# Patient Record
Sex: Female | Born: 1970 | Race: White | Hispanic: No | Marital: Married
Health system: Southern US, Community
[De-identification: ages and names within clinical notes are randomized; demographics above are authoritative.]

---

## 2019-02-27 ENCOUNTER — Inpatient Hospital Stay
Admit: 2019-02-27 | Discharge: 2019-03-19 | Disposition: E | Payer: Medicare Other | Source: Other Acute Inpatient Hospital | Attending: Internal Medicine | Admitting: Internal Medicine

## 2019-02-27 ENCOUNTER — Other Ambulatory Visit (HOSPITAL_COMMUNITY): Payer: Medicare Other

## 2019-02-27 DIAGNOSIS — Z431 Encounter for attention to gastrostomy: Secondary | ICD-10-CM

## 2019-02-27 DIAGNOSIS — N179 Acute kidney failure, unspecified: Secondary | ICD-10-CM

## 2019-02-27 DIAGNOSIS — R7981 Abnormal blood-gas level: Secondary | ICD-10-CM

## 2019-02-27 DIAGNOSIS — Z931 Gastrostomy status: Secondary | ICD-10-CM

## 2019-02-27 DIAGNOSIS — R109 Unspecified abdominal pain: Secondary | ICD-10-CM

## 2019-02-27 DIAGNOSIS — J9610 Chronic respiratory failure, unspecified whether with hypoxia or hypercapnia: Secondary | ICD-10-CM

## 2019-02-27 DIAGNOSIS — Z4659 Encounter for fitting and adjustment of other gastrointestinal appliance and device: Secondary | ICD-10-CM

## 2019-02-27 DIAGNOSIS — J189 Pneumonia, unspecified organism: Secondary | ICD-10-CM

## 2019-02-27 DIAGNOSIS — J918 Pleural effusion in other conditions classified elsewhere: Secondary | ICD-10-CM

## 2019-02-28 LAB — CBC WITH DIFFERENTIAL/PLATELET
Abs Immature Granulocytes: 0 10*3/uL (ref 0.00–0.07)
Basophils Absolute: 0 10*3/uL (ref 0.0–0.1)
Basophils Relative: 0 %
Eosinophils Absolute: 0.2 10*3/uL (ref 0.0–0.5)
Eosinophils Relative: 3 %
HCT: 34.2 % — ABNORMAL LOW (ref 36.0–46.0)
Hemoglobin: 10.9 g/dL — ABNORMAL LOW (ref 12.0–15.0)
Lymphocytes Relative: 21 %
Lymphs Abs: 1.5 10*3/uL (ref 0.7–4.0)
MCH: 35.6 pg — ABNORMAL HIGH (ref 26.0–34.0)
MCHC: 31.9 g/dL (ref 30.0–36.0)
MCV: 111.8 fL — ABNORMAL HIGH (ref 80.0–100.0)
Monocytes Absolute: 0.3 10*3/uL (ref 0.1–1.0)
Monocytes Relative: 4 %
Neutro Abs: 5.3 10*3/uL (ref 1.7–7.7)
Neutrophils Relative %: 72 %
Platelets: 162 10*3/uL (ref 150–400)
RBC: 3.06 MIL/uL — ABNORMAL LOW (ref 3.87–5.11)
RDW: 17.3 % — ABNORMAL HIGH (ref 11.5–15.5)
WBC: 7.3 10*3/uL (ref 4.0–10.5)
nRBC: 0 % (ref 0.0–0.2)
nRBC: 0 /100 WBC

## 2019-02-28 LAB — URINALYSIS, ROUTINE W REFLEX MICROSCOPIC
Bilirubin Urine: NEGATIVE
Glucose, UA: NEGATIVE mg/dL
Hgb urine dipstick: NEGATIVE
Ketones, ur: NEGATIVE mg/dL
Leukocytes,Ua: NEGATIVE
Nitrite: NEGATIVE
Protein, ur: 30 mg/dL — AB
Specific Gravity, Urine: 1.025 (ref 1.005–1.030)
pH: 7 (ref 5.0–8.0)

## 2019-02-28 LAB — HEMOGLOBIN A1C
Hgb A1c MFr Bld: 6.9 % — ABNORMAL HIGH (ref 4.8–5.6)
Mean Plasma Glucose: 151.33 mg/dL

## 2019-02-28 LAB — COMPREHENSIVE METABOLIC PANEL
ALT: 22 U/L (ref 0–44)
AST: 18 U/L (ref 15–41)
Albumin: 1.7 g/dL — ABNORMAL LOW (ref 3.5–5.0)
Alkaline Phosphatase: 80 U/L (ref 38–126)
Anion gap: 8 (ref 5–15)
BUN: 10 mg/dL (ref 6–20)
CO2: 27 mmol/L (ref 22–32)
Calcium: 8.6 mg/dL — ABNORMAL LOW (ref 8.9–10.3)
Chloride: 104 mmol/L (ref 98–111)
Creatinine, Ser: <0.3 mg/dL — ABNORMAL LOW (ref 0.44–1.00)
Glucose, Bld: 86 mg/dL (ref 70–99)
Potassium: 4.3 mmol/L (ref 3.5–5.1)
Sodium: 139 mmol/L (ref 135–145)
Total Bilirubin: 0.6 mg/dL (ref 0.3–1.2)
Total Protein: 5.4 g/dL — ABNORMAL LOW (ref 6.5–8.1)

## 2019-02-28 MED ORDER — HEPARIN LOCK FLUSH 10 UNIT/ML IV SOLN
5.00 | INTRAVENOUS | Status: DC
Start: ? — End: 2019-02-28

## 2019-02-28 MED ORDER — SODIUM CHLORIDE 0.9 % IV SOLN
10.00 | INTRAVENOUS | Status: DC
Start: ? — End: 2019-02-28

## 2019-02-28 MED ORDER — PROMETHAZINE HCL 25 MG/ML IJ SOLN
12.50 | INTRAMUSCULAR | Status: DC
Start: ? — End: 2019-02-28

## 2019-02-28 MED ORDER — ONDANSETRON HCL 4 MG/2ML IJ SOLN
4.00 | INTRAMUSCULAR | Status: DC
Start: ? — End: 2019-02-28

## 2019-02-28 MED ORDER — GENERIC EXTERNAL MEDICATION
2.00 | Status: DC
Start: 2019-02-27 — End: 2019-02-28

## 2019-02-28 MED ORDER — INSULIN GLARGINE 100 UNIT/ML ~~LOC~~ SOLN
1.00 | SUBCUTANEOUS | Status: DC
Start: ? — End: 2019-02-28

## 2019-02-28 MED ORDER — LOPERAMIDE HCL 1 MG/7.5ML PO LIQD
4.00 | ORAL | Status: DC
Start: 2019-02-27 — End: 2019-02-28

## 2019-02-28 MED ORDER — METRONIDAZOLE IN NACL 5-0.79 MG/ML-% IV SOLN
500.00 | INTRAVENOUS | Status: DC
Start: 2019-02-27 — End: 2019-02-28

## 2019-02-28 MED ORDER — SODIUM CHLORIDE (PF) 0.9 % IJ SOLN
10.00 | INTRAMUSCULAR | Status: DC
Start: 2019-02-27 — End: 2019-02-28

## 2019-02-28 MED ORDER — INSULIN LISPRO 100 UNIT/ML ~~LOC~~ SOLN
1.00 | SUBCUTANEOUS | Status: DC
Start: 2019-02-27 — End: 2019-02-28

## 2019-02-28 MED ORDER — GENERIC EXTERNAL MEDICATION
10.00 | Status: DC
Start: 2019-02-28 — End: 2019-02-28

## 2019-02-28 MED ORDER — FOLIC ACID 1 MG PO TABS
2.00 | ORAL_TABLET | ORAL | Status: DC
Start: 2019-02-28 — End: 2019-02-28

## 2019-02-28 MED ORDER — BISACODYL 10 MG RE SUPP
10.00 | RECTAL | Status: DC
Start: ? — End: 2019-02-28

## 2019-02-28 MED ORDER — INSULIN LISPRO 100 UNIT/ML ~~LOC~~ SOLN
1.00 | SUBCUTANEOUS | Status: DC
Start: ? — End: 2019-02-28

## 2019-02-28 MED ORDER — GENERIC EXTERNAL MEDICATION
Status: DC
Start: ? — End: 2019-02-28

## 2019-02-28 MED ORDER — INSULIN GLARGINE 100 UNIT/ML ~~LOC~~ SOLN
1.00 | SUBCUTANEOUS | Status: DC
Start: 2019-02-27 — End: 2019-02-28

## 2019-02-28 MED ORDER — MORPHINE SULFATE (PF) 2 MG/ML IV SOLN
2.00 | INTRAVENOUS | Status: DC
Start: ? — End: 2019-02-28

## 2019-02-28 MED ORDER — FLUCONAZOLE IN SODIUM CHLORIDE 400-0.9 MG/200ML-% IV SOLN
400.00 | INTRAVENOUS | Status: DC
Start: 2019-02-28 — End: 2019-02-28

## 2019-03-01 LAB — URINE CULTURE: Culture: NO GROWTH

## 2019-03-01 NOTE — Consult Note (Addendum)
Infectious Disease Consultation   Ariana Raymond  FUX:[id number]  DOB: [birth year]  DOA: 03/11/2019   Requesting physician: Dr.Hijazi  Reason for consultation: Antibiotic recommendations   History of Present Illness: History obtained from the medical records and also confirmed with the patient and husband at the bedside. Ariana Raymond is an 48 y.o. female with a history of morbid obesity, history of sleeve gastrectomy, asthma, diabetes mellitus type 2, metastatic breast cancer undergoing chemotherapy, coronary artery disease who initially presented to [facility] on 01/27/2019 complaining of fever and abdominal pain.  CT scan showed air adjacent to the stomach extending into the spleen suggesting gastric perforation.  She was then transferred to [facility] for further care.  Patient was taken to the operating room urgently on 01/27/2019 for exploratory laparotomy with drainage of intra-abdominal abscess.  There was purulent and fibrinous fluid around the stomach and all over the peritoneal cavity in the pelvis.  Cultures showed Candida albicans, Enterococcus, strep viridans.  On 01/30/2019 she developed respiratory distress and was transferred to the ICU.  She was placed on BiPAP and pressors.  CT chest angio showed markedly increased generalized groundglass opacity, patchy and multifocal most compatible with atypical infection including viral pneumonia.  Abdominal imaging showed larger perisplenic collection of fluid and gas.  COVID-19 was negative.  Legionella and pneumococcal antigen was negative.  She had a repeat CT of the chest/abdomen/pelvis done on 02/03/2019 for persistent abdominal pain which showed partial small bowel obstruction and intra-abdominal fluids.  She was taken again to the OR on 02/03/2019 for exploratory laparotomy and debridement of the abdominal wall abscess, repair of gastric perforation and omental flap and feeding tube placement.   Cultures from the surgery again showed yeast, Enterococcus, gram-positive organisms (bacillus, Corynebacterium).  She was initially treated with vancomycin, Eraxis, Zosyn.  On 02/07/2019 she developed severe thrombocytopenia with a drop of platelet count to 3.  D-dimer was elevated but elevated fibrinogen and normal PT/INR.  Therefore DIC was ruled out.  Peripheral smear showed severe thrombocytopenia without schistocytes.  Haptoglobin and LDH was elevated.  Heparin-induced platelet antibodies were negative.  Adams 13 low normal.  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  Heme-onc gave a dose of IVIG on 02/08/2019 and 02/09/2019.  Antimicrobials changed to IV daptomycin, aztreonam, fluconazole.  She had an upper GI with small bowel follow-through on 02/11/2019 showing gastric leak.  Chest x-ray on 02/14/2019 showed improving infiltrates.  Once the patient stabilized she has been discharged to [facility].  Patient at this time is complaining of some mild abdominal discomfort.  She has an NG tube in place.  Denies having any chest pain, shortness of breath, nausea, vomiting or diarrhea at this time.   Review of Systems:  As per HPI otherwise 10 point review of systems negative.    Past Medical History: Morbid obesity, metastatic breast cancer, diabetes mellitus type 2.  Past Surgical History: Recent abdominal surgery for bowel perforation, intra-abdominal abscess.  Allergies: Vancomycin, anidulafungin  Social History: No history of nicotine, alcohol, drug abuse.  Family History: History of diabetes mellitus, hypertension   Physical Exam: Vitals: Temperature 99, pulse 111, respiratory rate 18, blood pressure 110/53, pulse oximetry 90%. Constitutional: Obese, ill-appearing female, awake, oriented Eyes: PERLA, EOMI, irises appear normal, anicteric sclera,  ENMT: external ears and nose appear normal, normal hearing, has NG tube  Lips appears normal, oropharynx mucosa, tongue normal  Neck: neck appears normal, no masses, normal ROM  CVS: S1-S2 clear, no murmur rubs or gallops, LE edema Respiratory: Decreased breath sounds lower lobes, otherwise clear to auscultation. Abdomen: Obese female, abdominal wound VAC in place, tenderness without any guarding or rebound, positive bowel sounds Musculoskeletal: no cyanosis, clubbing, has edema                       Neuro: Cranial nerves II-XII intact, she has debility with some generalized weakness otherwise grossly nonfocal. Psych: judgement and insight appear normal, stable mood and affect, mental status Skin: no rashes, unstageable left gluteal, deep tissue pressure injury right heel, deep tissue pressure injury sacrococcygeal, deep tissue pressure injury left heel  Data reviewed:  I have personally reviewed following labs and imaging studies Labs:  CBC: Recent Labs  Lab 02/28/19 0540  WBC 7.3  NEUTROABS 5.3  HGB 10.9*  HCT 34.2*  MCV 111.8*  PLT 162    Basic Metabolic Panel: Recent Labs  Lab 02/28/19 0540  NA 139  K 4.3  CL 104  CO2 27  GLUCOSE 86  BUN 10  CREATININE <0.30*  CALCIUM 8.6*   GFR CrCl cannot be calculated (This lab value cannot be used to calculate CrCl because it is not a number: <0.30). Liver Function Tests: Recent Labs  Lab 02/28/19 0540  AST 18  ALT 22  ALKPHOS 80  BILITOT 0.6  PROT 5.4*  ALBUMIN 1.7*   No results for input(s): LIPASE, AMYLASE in the last 168 hours. No results for input(s): AMMONIA in the last 168 hours. Coagulation profile No results for input(s): INR, PROTIME in the last 168 hours.  Cardiac Enzymes: No results for input(s): CKTOTAL, CKMB, CKMBINDEX, TROPONINI in the last 168 hours. BNP: Invalid input(s): POCBNP CBG: No results for input(s): GLUCAP in the last 168 hours. D-Dimer No results for input(s): DDIMER in the last 72 hours. Hgb A1c Recent Labs    02/28/19 0540  HGBA1C 6.9*   Lipid  Profile No results for input(s): CHOL, HDL, LDLCALC, TRIG, CHOLHDL, LDLDIRECT in the last 72 hours. Thyroid function studies No results for input(s): TSH, T4TOTAL, T3FREE, THYROIDAB in the last 72 hours.  Invalid input(s): FREET3 Anemia work up No results for input(s): VITAMINB12, FOLATE, FERRITIN, TIBC, IRON, RETICCTPCT in the last 72 hours. Urinalysis    Component Value Date/Time   COLORURINE AMBER (A) 02/28/2019 0927   APPEARANCEUR HAZY (A) 02/28/2019 0927   LABSPEC 1.025 02/28/2019 0927   PHURINE 7.0 02/28/2019 0927   GLUCOSEU NEGATIVE 02/28/2019 0927   HGBUR NEGATIVE 02/28/2019 0927   BILIRUBINUR NEGATIVE 02/28/2019 0927   KETONESUR NEGATIVE 02/28/2019 0927   PROTEINUR 30 (A) 02/28/2019 0927   NITRITE NEGATIVE 02/28/2019 0927   LEUKOCYTESUR NEGATIVE 02/28/2019 1610     Microbiology Recent Results (from the past 240 hour(s))  Culture, Urine     Status: None   Collection Time: 02/28/19  9:28 AM   Specimen: Urine, Random  Result Value Ref Range Status   Specimen Description URINE, RANDOM  Final   Special Requests NONE  Final   Culture   Final    NO GROWTH Performed at [facility], [address], [place], [place] [address]    Report Status 03/01/2019 FINAL  Final       Inpatient Medications:   Please see MAR   Radiological Exams on Admission: Dg Abd 1 View  Result Date: 03-07-19 CLINICAL DATA:  48 year old female with respiratory distress. EXAM: PORTABLE CHEST 1 VIEW COMPARISON:  Chest radiograph dated 07/13/2012 FINDINGS: An enteric tube is noted extending below the diaphragm with tip over the spine, likely in the body of the stomach. Right-sided PICC with tip over central SVC close to the cavoatrial junction. A left subclavian central venous line with tip over the right atrium. Hazy left lung base density may represent atelectasis or developing infiltrate. Clinical correlation is recommended. No large pleural effusion. There is no pneumothorax. Mild  cardiomegaly. No acute osseous pathology. A drainage tube noted under the left hemidiaphragm with tip close to the gastroesophageal junction. An additional drainage catheter is seen in the left hemiabdomen. There is moderate stool in the colon. A dilated loop of small bowel in the left hemiabdomen measures approximately 5.3 cm. IMPRESSION: 1. Lines and tubes as described. 2. Hazy left lung base density may represent atelectasis or developing infiltrate. 3. Mild cardiomegaly. 4. Dilated loop of small bowel in the left hemiabdomen. Clinical correlation is recommended. Electronically Signed   By: Anner Crete M.D.   On: 02/22/2019 17:45   Dg Chest Port 1 View  Result Date: 02/22/2019 CLINICAL DATA:  48 year old female with respiratory distress. EXAM: PORTABLE CHEST 1 VIEW COMPARISON:  Chest radiograph dated 07/13/2012 FINDINGS: An enteric tube is noted extending below the diaphragm with tip over the spine, likely in the body of the stomach. Right-sided PICC with tip over central SVC close to the cavoatrial junction. A left subclavian central venous line with tip over the right atrium. Hazy left lung base density may represent atelectasis or developing infiltrate. Clinical correlation is recommended. No large pleural effusion. There is no pneumothorax. Mild cardiomegaly. No acute osseous pathology. A drainage tube noted under the left hemidiaphragm with tip close to the gastroesophageal junction. An additional drainage catheter is seen in the left hemiabdomen. There is moderate stool in the colon. A dilated loop of small bowel in the left hemiabdomen measures approximately 5.3 cm. IMPRESSION: 1. Lines and tubes as described. 2. Hazy left lung base density may represent atelectasis or developing infiltrate. 3. Mild cardiomegaly. 4. Dilated loop of small bowel in the left hemiabdomen. Clinical correlation is recommended. Electronically Signed   By: Anner Crete M.D.   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  Cultures showed Candida albicans, Enterococcus, strep viridans.  Patient initially was on IV vancomycin, Zosyn, anidulafungin.  However, she developed thrombocytopenia thought to be drug-induced.  The thrombocytopenia was really severe with an acute drop in the platelet count up to 3.  Therefore antibiotics have been changed to IV daptomycin, fluconazole, aztreonam.  Continue current antimicrobials.  She had upper GI series on 02/11/2019 which showed gastric leak.  She will need repeat upper GI series around 03/12/2019 to reevaluate.  As long as she has a gastric leak, recommend to continue with the antimicrobials.  Duration of antibiotics until the gastric perforation closes. Currently has a wound VAC in place.  Continue local wound care.  She is at high risk for recurrent sepsis.  If she starts having worsening fevers,  worsening leukocytosis would recommend to send for repeat pancultures.  Sepsis: This was present on admission.  Currently blood pressure is stable.  However, as mentioned above she is at high risk for recurrent sepsis.  Continue antimicrobials as mentioned above.  If she starts having any worsening fevers, leukocytosis then would recommend to send for repeat pancultures.  Gastric leak: Patient had upper GI series at the outside hospital which showed gastric leak.  She will need repeat upper GI series around  03/12/2019 to reevaluate.  Until then we recommend to continue the current antimicrobials.  Metastatic breast cancer: Patient was on chemotherapy.  Currently not on chemotherapy.  Severe thrombocytopenia: Likely secondary to drug-induced ITP.  Patient had severe thrombocytopenia with a drop in platelet count acutely.  Platelets had dropped to 3.  Heparin-induced platelet antibodies was negative, Adamts 13 was low normal.  She got IVIG 9/23 and 9/24.  Hematology was consulted at the outside facility.  She was also treated with steroids.  Antibiotics changed.  Currently platelet count improved.  Continue to monitor.  Acute hypoxemic respiratory failure: At the outside hospital on 01/30/2019 patient developed respiratory distress and had to be transferred to the ICU.  She was placed on BiPAP and vasopressors.  CT angio at that time showed increased generalized groundglass opacity, patchy and multifocal most compatible with atypical infection including viral pneumonia.  Respiratory bio fire, COVID-19 was negative.  She was extubated on 02/09/2019. On antimicrobials as mentioned above.  Continue to monitor.  If her respiratory status worsens consider obtaining repeat chest imaging.  Left gluteal unstageable pressure ulcer, deep tissue pressure injury right heel, deep tissue pressure injury left heel, deep tissue pressure injury sacrococcygeal: Continue local wound care.  Diabetes mellitus: Continue to monitor Accu-Cheks and medications for diabetes per the primary team.  She will need proper glycemic control in order to enable wound healing.  Due to her complex medical problems she is a high risk for worsening anticoagulation.  Thank you for this consultation.    Time spent 110 minutes more than 50% time spent face-to-face with patient, counseling and coordination of care.  Vonzella NippleAnupama Rondi Ivy M.D. 03/01/2019, 4:53 PM

## 2019-03-03 LAB — CBC
HCT: 34.8 % — ABNORMAL LOW (ref 36.0–46.0)
Hemoglobin: 10.8 g/dL — ABNORMAL LOW (ref 12.0–15.0)
MCH: 35.1 pg — ABNORMAL HIGH (ref 26.0–34.0)
MCHC: 31 g/dL (ref 30.0–36.0)
MCV: 113 fL — ABNORMAL HIGH (ref 80.0–100.0)
Platelets: 197 10*3/uL (ref 150–400)
RBC: 3.08 MIL/uL — ABNORMAL LOW (ref 3.87–5.11)
RDW: 16.7 % — ABNORMAL HIGH (ref 11.5–15.5)
WBC: 7.4 10*3/uL (ref 4.0–10.5)
nRBC: 0 % (ref 0.0–0.2)

## 2019-03-03 LAB — BASIC METABOLIC PANEL
Anion gap: 10 (ref 5–15)
BUN: 11 mg/dL (ref 6–20)
CO2: 20 mmol/L — ABNORMAL LOW (ref 22–32)
Calcium: 8.2 mg/dL — ABNORMAL LOW (ref 8.9–10.3)
Chloride: 105 mmol/L (ref 98–111)
Creatinine, Ser: 0.38 mg/dL — ABNORMAL LOW (ref 0.44–1.00)
GFR calc Af Amer: >60 mL/min (ref >60)
GFR calc non Af Amer: >60 mL/min (ref >60)
Glucose, Bld: 117 mg/dL — ABNORMAL HIGH (ref 70–99)
Potassium: 4.3 mmol/L (ref 3.5–5.1)
Sodium: 135 mmol/L (ref 135–145)

## 2019-03-07 ENCOUNTER — Other Ambulatory Visit (HOSPITAL_COMMUNITY): Payer: Medicare Other

## 2019-03-07 ENCOUNTER — Ambulatory Visit (HOSPITAL_COMMUNITY): Payer: No Typology Code available for payment source | Attending: Internal Medicine

## 2019-03-07 DIAGNOSIS — I2510 Atherosclerotic heart disease of native coronary artery without angina pectoris: Secondary | ICD-10-CM | POA: Diagnosis not present

## 2019-03-07 DIAGNOSIS — E119 Type 2 diabetes mellitus without complications: Secondary | ICD-10-CM | POA: Diagnosis not present

## 2019-03-07 DIAGNOSIS — R609 Edema, unspecified: Secondary | ICD-10-CM

## 2019-03-07 DIAGNOSIS — R0602 Shortness of breath: Secondary | ICD-10-CM | POA: Diagnosis not present

## 2019-03-07 MED ORDER — GENERIC EXTERNAL MEDICATION
Status: DC
Start: ? — End: 2019-03-07

## 2019-03-07 NOTE — Progress Notes (Signed)
Right upper extremity venous duplex complete. Please see CV Proc tab for preliminary results. [place], RVT 4:21 PM  03/07/2019

## 2019-03-07 NOTE — Progress Notes (Signed)
IR requested by Dr. Laren Everts for management of gastrostomy tube dysfunction.  Patient had gastrostomy tube placed in OR at [facility] 02/03/2019 by Dr. Candiss Norse per records.  Dr. Laren Everts states that gastrostomy tube appears in place, however is not functioning properly. Recommend KUB with contrast for evaluation of gastrostomy tube placement. Dr. Laren Everts aware and will place order.  IR to follow.   Bea Graff Setareh Rom, PA-C 03/07/2019, 3:21 PM

## 2019-03-08 LAB — COMPREHENSIVE METABOLIC PANEL
ALT: 20 U/L (ref 0–44)
AST: 30 U/L (ref 15–41)
Albumin: 1.2 g/dL — ABNORMAL LOW (ref 3.5–5.0)
Alkaline Phosphatase: 186 U/L — ABNORMAL HIGH (ref 38–126)
Anion gap: 8 (ref 5–15)
BUN: 10 mg/dL (ref 6–20)
CO2: 22 mmol/L (ref 22–32)
Calcium: 7.9 mg/dL — ABNORMAL LOW (ref 8.9–10.3)
Chloride: 105 mmol/L (ref 98–111)
Creatinine, Ser: 0.41 mg/dL — ABNORMAL LOW (ref 0.44–1.00)
GFR calc Af Amer: >60 mL/min (ref >60)
GFR calc non Af Amer: >60 mL/min (ref >60)
Glucose, Bld: 149 mg/dL — ABNORMAL HIGH (ref 70–99)
Potassium: 5 mmol/L (ref 3.5–5.1)
Sodium: 135 mmol/L (ref 135–145)
Total Bilirubin: 0.8 mg/dL (ref 0.3–1.2)
Total Protein: 4.7 g/dL — ABNORMAL LOW (ref 6.5–8.1)

## 2019-03-08 LAB — CBC
HCT: 34.9 % — ABNORMAL LOW (ref 36.0–46.0)
Hemoglobin: 10.7 g/dL — ABNORMAL LOW (ref 12.0–15.0)
MCH: 34.9 pg — ABNORMAL HIGH (ref 26.0–34.0)
MCHC: 30.7 g/dL (ref 30.0–36.0)
MCV: 113.7 fL — ABNORMAL HIGH (ref 80.0–100.0)
Platelets: 189 10*3/uL (ref 150–400)
RBC: 3.07 MIL/uL — ABNORMAL LOW (ref 3.87–5.11)
RDW: 16 % — ABNORMAL HIGH (ref 11.5–15.5)
WBC: 16.9 10*3/uL — ABNORMAL HIGH (ref 4.0–10.5)
nRBC: 0 % (ref 0.0–0.2)

## 2019-03-09 NOTE — Progress Notes (Addendum)
PROGRESS NOTE    [name]  DQQ:[id number] DOB: 1971-01-25 DOA: Mar 18, 2019  Brief Narrative:  Ariana Raymond is an 48 y.o. female with a history of morbid obesity, history of sleeve gastrectomy, asthma, diabetes mellitus type 2, metastatic breast cancer undergoing chemotherapy, coronary artery disease who initially presented to [facility] on 01/27/2019 complaining of fever and abdominal pain.  CT scan showed air adjacent to the stomach extending into the spleen suggesting gastric perforation.  She was then transferred to [facility] for further care.  Patient was taken to the operating room urgently on 01/27/2019 for exploratory laparotomy with drainage of intra-abdominal abscess.  There was purulent and fibrinous fluid around the stomach and all over the peritoneal cavity in the pelvis.  Cultures showed Candida albicans, Enterococcus, strep viridans.  On 01/30/2019 she developed respiratory distress and was transferred to the ICU.  She was placed on BiPAP and pressors.  CT chest angio showed markedly increased generalized groundglass opacity, patchy and multifocal most compatible with atypical infection including viral pneumonia.  Abdominal imaging showed larger perisplenic collection of fluid and gas.  COVID-19 was negative.  Legionella and pneumococcal antigen was negative.  She had a repeat CT of the chest/abdomen/pelvis done on 02/03/2019 for persistent abdominal pain which showed partial small bowel obstruction and intra-abdominal fluids.  She was taken again to the OR on 02/03/2019 for exploratory laparotomy and debridement of the abdominal wall abscess, repair of gastric perforation and omental flap and feeding tube placement.  Cultures from the surgery again showed yeast, Enterococcus, gram-positive organisms (bacillus, Corynebacterium).  She was initially treated with vancomycin, Eraxis, Zosyn.  On 02/07/2019 she developed severe thrombocytopenia with drop of  platelet count to 3.  D-dimer was elevated but elevated fibrinogen and normal PT/INR.  Therefore DIC was ruled out.  Peripheral smear showed severe thrombocytopenia without schistocytes.  Haptoglobin and LDH was elevated.  Heparin-induced platelet antibodies were negative.  Adams 13 low normal.  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  Heme-onc gave a dose of IVIG on 02/08/2019 and 02/09/2019.  Antimicrobials changed to IV daptomycin, aztreonam, fluconazole.  She had an upper GI with small bowel follow-through on 02/11/2019 showing gastric leak.  Chest x-ray on 02/14/2019 showed improving infiltrates.  Once the patient stabilized she has been discharged to select hospital [facility].  Patient at this time continuing to complain of mild abdominal discomfort.  She has NG tube in place.  Denies having any chest pain, shortness of breath, nausea, vomiting or diarrhea at this time.   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  Cultures showed Candida albicans, Enterococcus, strep viridans.  Patient initially was on IV vancomycin, Zosyn, anidulafungin.  However, she developed thrombocytopenia thought to be drug-induced.  The thrombocytopenia was really severe with an acute drop in the platelet count up to 3.  Therefore antibiotics have been changed to IV daptomycin, fluconazole, aztreonam.  Continue  current antimicrobials.  She had upper GI with small bowel follow-through on 02/11/2019 which showed gastric leak.  She will need  repeat upper GI series with small bowel follow-through around 03/12/2019 to reevaluate.  As long as she has a gastric leak, recommend to continue with the antimicrobials.  Duration of antibiotics until the gastric perforation closes. Currently has a wound VAC in place.  Continue local wound care.  She is at high risk for recurrent sepsis.  If she starts having worsening fevers, would recommend to send for repeat pancultures.  Leukocytosis: Patient found to have elevated WBCs but afebrile.  The leukocytosis could be secondary to the DVT that was diagnosed.  Already on IV daptomycin, aztreonam, fluconazole. Continue current antimicrobials.  Continue to monitor counts closely.  If she starts having any fever spikes would recommend to send for pan cultures.  Sepsis: This was present on admission.  Currently sepsis is resolved.  However, as mentioned above, she is at high risk for recurrent sepsis.  Continue antimicrobials as mentioned above.  If she starts having any worsening fevers, would recommend to send for repeat pancultures.  Gastric leak: Patient had upper GI series with small bowel follow-through at the outside hospital which showed gastric leak.  She will need repeat upper GI series with small bowel follow-through around 03/12/2019 to reevaluate.  Until then recommend to continue the current antimicrobials.  DVT: She had right upper extremity ultrasound which showed findings consistent with short segment of age indeterminate deep venous thrombosis involving the left subclavian vein likely attached to pick.  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  Patient started on anticoagulation per the primary team.  Continue management per the primary team.  Will need to monitor her platelets closely while on anticoagulation as well as while on  antibiotics.  Metastatic breast cancer: Patient was on chemotherapy.  Currently not on chemotherapy.  Severe thrombocytopenia: Likely secondary to drug-induced ITP.  Patient had severe thrombocytopenia with a drop in platelet count acutely.  Platelets had dropped to 3.  Heparin-induced platelet antibodies was negative, Adamts 13 was low normal.  She got IVIG 9/23 and 9/24.  Hematology was consulted at the outside facility.  She was also treated with steroids.  Antibiotics changed.  Currently platelet count improved.  Continue to monitor.  Acute hypoxemic respiratory failure: At the outside hospital on 01/30/2019 patient developed respiratory distress and had to be transferred to the ICU.  She was placed on BiPAP and vasopressors.  CT angio at that time showed increased generalized groundglass opacity, patchy and multifocal most compatible with atypical infection including viral pneumonia.  Respiratory bio fire, COVID-19 was negative.  She was extubated on 02/09/2019. On antimicrobials as mentioned above.  Continue to monitor.  If her respiratory status worsens consider obtaining repeat chest imaging.  Left gluteal unstageable pressure ulcer, deep tissue pressure injury right heel, deep tissue pressure injury left heel, deep tissue pressure injury sacrococcygeal: Continue local wound care.  Diabetes mellitus: Continue to monitor Accu-Cheks and medications for diabetes per the primary team.  She will need proper glycemic control in order to enable wound healing.  Due to her complex medical problems she is a high risk for worsening and decompensation.    Subjective: She is continuing to complain of some abdominal discomfort.  Denies having any nausea, vomiting or diarrhea at this time.  Continues to have NG tube.  Having leukocytosis, no fever. Diagnosed with right upper extremity DVT.  Objective: Vitals: Temperature 98.1, pulse 86, respiratory 14, blood pressure 102/58, oxygen saturation  98%.  Examination:  General exam: Obese female, ill-appearing, awake and oriented, not in  any acute distress at this time HEENT: Atraumatic, normocephalic, pupils equal and reactive, no urine lesion, has NG tube. Respiratory system: Decreased breath sounds lower lobes otherwise clear to auscultation. Respiratory effort normal. Cardiovascular system: S1 & S2 heard, RRR. No murmur. Has pedal edema. Gastrointestinal system: Obese, abdominal wound VAC in place, nonspecific tenderness without any guarding or rebound, positive bowel sounds Central nervous system: Alert and oriented.  Has debility with generalized weakness otherwise no focal neurological deficits. Extremities: Symmetric 5 x 5 power.  Lower extremity edema Skin: No rashes, unstageable left gluteal, and right heel deep tissue pressure injury, deep tissue pressure injury sacrococcygeal, deep tissue pressure injury left heel Psychiatry: Judgement and insight appear normal. Mood & affect appropriate.     Data Reviewed: I have personally reviewed following labs and imaging studies  CBC: Recent Labs  Lab 03/03/19 0605 03/08/19 0547  WBC 7.4 16.9*  HGB 10.8* 10.7*  HCT 34.8* 34.9*  MCV 113.0* 113.7*  PLT 197 189   Basic Metabolic Panel: Recent Labs  Lab 03/03/19 0605 03/08/19 0547  NA 135 135  K 4.3 5.0  CL 105 105  CO2 20* 22  GLUCOSE 117* 149*  BUN 11 10  CREATININE 0.38* 0.41*  CALCIUM 8.2* 7.9*   GFR: CrCl cannot be calculated (Unknown ideal weight.). Liver Function Tests: Recent Labs  Lab 03/08/19 0547  AST 30  ALT 20  ALKPHOS 186*  BILITOT 0.8  PROT 4.7*  ALBUMIN 1.2*   No results for input(s): LIPASE, AMYLASE in the last 168 hours. No results for input(s): AMMONIA in the last 168 hours. Coagulation Profile: No results for input(s): INR, PROTIME in the last 168 hours. Cardiac Enzymes: No results for input(s): CKTOTAL, CKMB, CKMBINDEX, TROPONINI in the last 168 hours. BNP (last 3 results) No  results for input(s): PROBNP in the last 8760 hours. HbA1C: No results for input(s): HGBA1C in the last 72 hours. CBG: No results for input(s): GLUCAP in the last 168 hours. Lipid Profile: No results for input(s): CHOL, HDL, LDLCALC, TRIG, CHOLHDL, LDLDIRECT in the last 72 hours. Thyroid Function Tests: No results for input(s): TSH, T4TOTAL, FREET4, T3FREE, THYROIDAB in the last 72 hours. Anemia Panel: No results for input(s): VITAMINB12, FOLATE, FERRITIN, TIBC, IRON, RETICCTPCT in the last 72 hours. Sepsis Labs: No results for input(s): PROCALCITON, LATICACIDVEN in the last 168 hours.  Recent Results (from the past 240 hour(s))  Culture, Urine     Status: None   Collection Time: 02/28/19  9:28 AM   Specimen: Urine, Random  Result Value Ref Range Status   Specimen Description URINE, RANDOM  Final   Special Requests NONE  Final   Culture   Final    NO GROWTH Performed at [facility], [address] [place], [place] [address]    Report Status 03/01/2019 FINAL  Final         Radiology Studies: Dg Abd 1 View  Result Date: 03/07/2019 CLINICAL DATA:  Peg tube malfunction EXAM: ABDOMEN - 1 VIEW COMPARISON:  May 04, 2019 FINDINGS: Esophageal tube tip overlies the proximal stomach. Gastrostomy tubing visualized over the left mid abdomen. Catheter or drain over the left mid to lower abdomen. Nonobstructed gas pattern. IMPRESSION: 1. Gastrostomy tubing visualized over the left mid abdomen. 2. Removal of left upper quadrant drain. Additional catheter or tubing over the left mid to lower abdomen. 3. Nonobstructed gas pattern Electronically Signed   By: Jasmine PangKim  Fujinaga M.D.   On: 03/07/2019 17:39   [place] Upper Extremity  Venous Duplex  Result Date: 03/08/2019 UPPER VENOUS STUDY  Performing Technologist: Levada Schilling RDMS, RVT  Examination Guidelines: A complete evaluation includes B-mode imaging, spectral Doppler, color Doppler, and power Doppler as needed of all accessible portions  of each vessel. Bilateral testing is considered an integral part of a complete examination. Limited examinations for reoccurring indications may be performed as noted.  Right Findings: +----------+------------+---------+-----------+--------------+----------------+ RIGHT     CompressiblePhasicitySpontaneous  Properties      Summary      +----------+------------+---------+-----------+--------------+----------------+ IJV           Full       Yes       Yes                                   +----------+------------+---------+-----------+--------------+----------------+ Subclavian    Full       Yes       Yes        softly          Age                                                    echogenic    Indeterminate   +----------+------------+---------+-----------+--------------+----------------+ Axillary      Full                                                       +----------+------------+---------+-----------+--------------+----------------+ Brachial      Full                                                       +----------+------------+---------+-----------+--------------+----------------+ Radial        Full                                                       +----------+------------+---------+-----------+--------------+----------------+ Ulnar         Full                                                       +----------+------------+---------+-----------+--------------+----------------+ Cephalic    Partial                                         Chronic      +----------+------------+---------+-----------+--------------+----------------+ Basilic       Full                                                       +----------+------------+---------+-----------+--------------+----------------+  PICC line noted. Small area of echogenicity mid subclavian, likely nonocclusive thrombus on PICC line. Difficult exam due to large bandage on upper right inner arm.   Left Findings: +----------+------------+---------+-----------+----------+-------+ LEFT      CompressiblePhasicitySpontaneousPropertiesSummary +----------+------------+---------+-----------+----------+-------+ Subclavian    Full       Yes       Yes                      +----------+------------+---------+-----------+----------+-------+  Summary:  Right: Findings consistent with a short segment of age indeterminate deep vein thrombosis involving the right subclavian veinlikely attached to PICC. Findings consistent with chronic superficial vein thrombosis involving the right cephalic vein. Difficult evaluation due to large PICC bandage.  *See table(s) above for measurements and observations.  Diagnosing physician: Curt Jews MD Electronically signed by Curt Jews MD on 03/08/2019 at 4:00:20 PM.    Final         Scheduled Meds: Please see MAR  Yaakov Guthrie, MD  03/09/2019, 2:39 PM

## 2019-03-10 NOTE — Progress Notes (Signed)
PROGRESS NOTE    Ariana PattersonKristy Cooper Vonada  NWG:[id number] DOB: 12/26/1970 DOA: 02/21/2019  Brief Narrative:  Ariana PattersonKristy Cooper Everhartis an 48 y.o.femalewith a history of morbid obesity, history of sleeve gastrectomy, asthma, diabetes mellitus type 2, metastatic breast cancer undergoing chemotherapy, coronary artery disease who initially presented to [facility] on 01/27/2019 complaining of fever and abdominal pain. CT scan showed air adjacent to the stomach extending into the spleen suggesting gastric perforation. She was then [facility]. Patient was taken to the operating room urgently on 01/27/2019 for exploratory laparotomy with drainage of intra-abdominal abscess. There was purulent and fibrinous fluid around the stomach and all over the peritoneal cavity in the pelvis. Cultures showed Candida albicans, Enterococcus, strep viridans. On 01/30/2019 she developed respiratory distress and was transferred to the ICU. She was placed on BiPAP and pressors. CT chest angio showed markedly increased generalized groundglass opacity, patchy and multifocal most compatible with atypical infection including viral pneumonia. Abdominal imaging showed larger perisplenic collection of fluid and gas. COVID-19 was negative. Legionella and pneumococcal antigen was negative. She had a repeat CT of the chest/abdomen/pelvis done on 02/03/2019 for persistent abdominal pain which showed partial small bowel obstruction and intra-abdominal fluids. She was taken again to the OR on 02/03/2019 for exploratory laparotomy and debridement of the abdominal wall abscess, repair of gastric perforation and omental flap and feeding tube placement. Cultures from the surgery again showed yeast, Enterococcus, gram-positive organisms (bacillus, Corynebacterium). She was initially treated with vancomycin, Eraxis, Zosyn. On 02/07/2019 she developed severe thrombocytopenia with drop of  platelet count to 3. D-dimer was elevated but elevated fibrinogen and normal PT/INR. Therefore DIC was ruled out. Peripheral smear showed severe thrombocytopenia without schistocytes. Haptoglobin and LDH was elevated. Heparin-induced platelet antibodies were negative. Adams 13 low normal. 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 Heme-onc gave a dose of IVIG on 02/08/2019 and 02/09/2019. Antimicrobials changed to IV daptomycin, aztreonam, fluconazole. She had an upper GI with small bowel follow-through on 02/11/2019 showing gastric leak. Chest x-ray on 02/14/2019 showed improving infiltrates. Once the patient stabilized [facility] Having mild abdominal discomfort. She has NG tube in place. Denies having any chest pain, shortness of breath, nausea, vomiting or diarrhea at this time.   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 Cultures showed Candida albicans, Enterococcus, strep viridans. Patient initially was on IV vancomycin, Zosyn, anidulafungin. However, she developed thrombocytopenia thought to be drug-induced. The thrombocytopenia was really severe with an acute drop in the platelet count up to 3. Therefore antibiotics have been changed to IV daptomycin, fluconazole, aztreonam. She had upper GI with small bowel  follow-through on 02/11/2019 which showed gastric leak. She will need repeat upper GI series with small bowel follow-through around 03/12/2019 to reevaluate. As long as she has a gastric leak,recommend to continue with the antimicrobials. Duration of antibiotics until the gastric perforation closes. Currently has a wound VAC in place.Continue local wound care. She is at high risk for recurrent  sepsis. If she starts having worsening fevers, would recommend to send for repeat pancultures.  Leukocytosis: Patient found to have elevated WBCs but afebrile.  The leukocytosis could be secondary to the DVT that was diagnosed.  Already on IV daptomycin, aztreonam, fluconazole. Continue current antimicrobials.  Continue to monitor counts closely.  If she starts having any fever spikes would recommend to send for pan cultures.  Sepsis: This was present on admission. Currently sepsis is resolved. However, as mentioned above, she is at high risk for recurrent sepsis. Continue antimicrobials as mentioned above. If she starts having any worsening fevers, would recommend to send for repeat pancultures.  Gastric leak: Patient had upper GI series with small bowel follow-through at the outside hospital which showed gastric leak. She will need repeat upper GI series with small bowel follow-through around 03/12/2019 to reevaluate. Until then recommend to continue the current antimicrobials.  DVT: She had right upper extremity ultrasound which showed findings consistent with short segment of age indeterminate deep venous thrombosis involving the left subclavian vein likely attached to pick.  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  Patient started on anticoagulation per the primary team.  Continue management per the primary team.  Will need to monitor her platelets closely while on anticoagulation as well as while on antibiotics.  Metastatic breast cancer: Patient was on  chemotherapy. Currently not on chemotherapy.  Severe thrombocytopenia: Likely secondary to drug-induced ITP. Patient had severe thrombocytopenia with a drop in platelet count acutely. Platelets had dropped to 3. Heparin-induced platelet antibodies was negative, Adamts13 was low normal. S/p IVIG 9/23 and 9/24. Seen by Hematology at the outside facility. She was also treated with steroids. On antimicrobials as mentioned above. Currently platelet count improved. Continue to monitor.  Acute hypoxemic respiratory failure: At the outside hospital on 01/30/2019 patient developed respiratory distress and had to be transferred to the ICU. She was placed on BiPAP and vasopressors. CT angio at that time showed increased generalized groundglass opacity, patchy and multifocal most compatible with atypical infection including viral pneumonia. Respiratory bio fire, COVID-19 was negative. She was extubated on 02/09/2019. On antimicrobials as mentioned above. Continue to monitor. If her respiratory status worsens, consider obtaining repeat chest imaging.  Left gluteal unstageable pressure ulcer, deep tissue pressure injury right heel, deep tissue pressure injury left heel, deep tissue pressure injury sacrococcygeal: Continue local wound care.  Diabetes mellitus: Continue to monitor Accu-Cheks and medications for diabetes per the primary team. She will need proper glycemic control in order to enable wound healing.  Due to her complex medical problems she is a high risk for worsening and decompensation.   Subjective: Afebrile, no acute events. Having mild abdominal discomfort.  Objective: Vitals: Temperature 98.3, pulse 100, respiratory rate 14, blood pressure 103/67, pulse oximetry 99%.  Examination: General exam: Obese female, awake and oriented, not in any acute distress at this time HEENT: Atraumatic, normocephalic, pupils equal and reactive, has NG tube Respiratory system: Decreased  breath sounds lower lobes.  No rhonchi or wheezing. Cardiovascular system: S1 & S2 heard, RRR. No murmur.  Gastrointestinal system: Obese, abdominal wound VAC in place, nonspecific tenderness without any guarding or rebound, positive bowel sounds Central nervous system: Alert and oriented. No focal neurological deficits. Extremities: Symmetric 5 x 5 power.  Lower extremity edema. Skin: No rashes, unstageable left gluteal, right heel deep tissue pressure injury, deep tissue pressure injury sacrococcygeal, deep tissue pressure injury left heel Psychiatry: Judgement and insight appear normal. Mood & affect appropriate.  Data Reviewed: I have personally reviewed following labs and imaging studies  CBC: Recent Labs  Lab 03/08/19 0547  WBC 16.9*  HGB 10.7*  HCT 34.9*  MCV 113.7*  PLT 189   Basic Metabolic Panel: Recent Labs  Lab 03/08/19 0547  NA 135  K 5.0  CL 105  CO2 22  GLUCOSE 149*  BUN 10  CREATININE 0.41*  CALCIUM 7.9*   GFR: CrCl cannot be calculated (Unknown ideal weight.). Liver Function Tests: Recent Labs  Lab 03/08/19 0547  AST 30  ALT 20  ALKPHOS 186*  BILITOT 0.8  PROT 4.7*  ALBUMIN 1.2*   No results for input(s): LIPASE, AMYLASE in the last 168 hours. No results for input(s): AMMONIA in the last 168 hours. Coagulation Profile: No results for input(s): INR, PROTIME in the last 168 hours. Cardiac Enzymes: No results for input(s): CKTOTAL, CKMB, CKMBINDEX, TROPONINI in the last 168 hours. BNP (last 3 results) No results for input(s): PROBNP in the last 8760 hours. HbA1C: No results for input(s): HGBA1C in the last 72 hours. CBG: No results for input(s): GLUCAP in the last 168 hours. Lipid Profile: No results for input(s): CHOL, HDL, LDLCALC, TRIG, CHOLHDL, LDLDIRECT in the last 72 hours. Thyroid Function Tests: No results for input(s): TSH, T4TOTAL, FREET4, T3FREE, THYROIDAB in the last 72 hours. Anemia Panel: No results for input(s):  VITAMINB12, FOLATE, FERRITIN, TIBC, IRON, RETICCTPCT in the last 72 hours. Sepsis Labs: No results for input(s): PROCALCITON, LATICACIDVEN in the last 168 hours.  No results found for this or any previous visit (from the past 240 hour(s)).       Radiology Studies: No results found.   Scheduled Meds: Please see MAR  Vonzella Nipple, MD  03/10/2019, 4:12 PM

## 2019-03-11 ENCOUNTER — Other Ambulatory Visit (HOSPITAL_COMMUNITY): Payer: Medicare Other

## 2019-03-11 LAB — CBC
HCT: 35.8 % — ABNORMAL LOW (ref 36.0–46.0)
Hemoglobin: 11.3 g/dL — ABNORMAL LOW (ref 12.0–15.0)
MCH: 34.8 pg — ABNORMAL HIGH (ref 26.0–34.0)
MCHC: 31.6 g/dL (ref 30.0–36.0)
MCV: 110.2 fL — ABNORMAL HIGH (ref 80.0–100.0)
Platelets: 218 10*3/uL (ref 150–400)
RBC: 3.25 MIL/uL — ABNORMAL LOW (ref 3.87–5.11)
RDW: 15.8 % — ABNORMAL HIGH (ref 11.5–15.5)
WBC: 13.9 10*3/uL — ABNORMAL HIGH (ref 4.0–10.5)
nRBC: 0 % (ref 0.0–0.2)

## 2019-03-11 LAB — URINALYSIS, ROUTINE W REFLEX MICROSCOPIC
Bilirubin Urine: NEGATIVE
Glucose, UA: 50 mg/dL — AB
Ketones, ur: NEGATIVE mg/dL
Nitrite: NEGATIVE
Protein, ur: 100 mg/dL — AB
RBC / HPF: >50 RBC/hpf — ABNORMAL HIGH (ref 0–5)
Specific Gravity, Urine: 1.026 (ref 1.005–1.030)
WBC, UA: >50 WBC/hpf — ABNORMAL HIGH (ref 0–5)
pH: 5 (ref 5.0–8.0)

## 2019-03-11 LAB — BASIC METABOLIC PANEL
Anion gap: 8 (ref 5–15)
BUN: 8 mg/dL (ref 6–20)
CO2: 21 mmol/L — ABNORMAL LOW (ref 22–32)
Calcium: 8.1 mg/dL — ABNORMAL LOW (ref 8.9–10.3)
Chloride: 104 mmol/L (ref 98–111)
Creatinine, Ser: 0.51 mg/dL (ref 0.44–1.00)
GFR calc Af Amer: >60 mL/min (ref >60)
GFR calc non Af Amer: >60 mL/min (ref >60)
Glucose, Bld: 160 mg/dL — ABNORMAL HIGH (ref 70–99)
Potassium: 4.4 mmol/L (ref 3.5–5.1)
Sodium: 133 mmol/L — ABNORMAL LOW (ref 135–145)

## 2019-03-11 LAB — CK: Total CK: <5 U/L — ABNORMAL LOW (ref 38–234)

## 2019-03-11 LAB — HEPATIC FUNCTION PANEL
ALT: 30 U/L (ref 0–44)
AST: 38 U/L (ref 15–41)
Albumin: 1.3 g/dL — ABNORMAL LOW (ref 3.5–5.0)
Alkaline Phosphatase: 254 U/L — ABNORMAL HIGH (ref 38–126)
Bilirubin, Direct: 0.3 mg/dL — ABNORMAL HIGH (ref 0.0–0.2)
Indirect Bilirubin: 0.3 mg/dL (ref 0.3–0.9)
Total Bilirubin: 0.6 mg/dL (ref 0.3–1.2)
Total Protein: 5.2 g/dL — ABNORMAL LOW (ref 6.5–8.1)

## 2019-03-11 LAB — BLOOD GAS, ARTERIAL
Acid-base deficit: 3.5 mmol/L — ABNORMAL HIGH (ref 0.0–2.0)
Bicarbonate: 21.7 mmol/L (ref 20.0–28.0)
O2 Content: 5 L/min
O2 Saturation: 87.3 %
Patient temperature: 98.6
pCO2 arterial: 43.4 mmHg (ref 32.0–48.0)
pH, Arterial: 7.319 — ABNORMAL LOW (ref 7.350–7.450)
pO2, Arterial: 59 mmHg — ABNORMAL LOW (ref 83.0–108.0)

## 2019-03-11 LAB — AMYLASE: Amylase: 20 U/L — ABNORMAL LOW (ref 28–100)

## 2019-03-11 LAB — LACTIC ACID, PLASMA: Lactic Acid, Venous: 3.7 mmol/L (ref 0.5–1.9)

## 2019-03-11 LAB — AMMONIA: Ammonia: 64 umol/L — ABNORMAL HIGH (ref 9–35)

## 2019-03-11 LAB — LIPASE, BLOOD: Lipase: 17 U/L (ref 11–51)

## 2019-03-11 MED ORDER — GENERIC EXTERNAL MEDICATION
Status: DC
Start: ? — End: 2019-03-11

## 2019-03-12 ENCOUNTER — Other Ambulatory Visit (HOSPITAL_COMMUNITY): Payer: Medicare Other

## 2019-03-12 LAB — BLOOD GAS, ARTERIAL
Acid-base deficit: 12.5 mmol/L — ABNORMAL HIGH (ref 0.0–2.0)
Bicarbonate: 14.7 mmol/L — ABNORMAL LOW (ref 20.0–28.0)
Delivery systems: POSITIVE
Expiratory PAP: 6
FIO2: 0.5
Inspiratory PAP: 12
O2 Saturation: 92.4 %
Patient temperature: 98.6
RATE: 20 resp/min
pCO2 arterial: 43.8 mmHg (ref 32.0–48.0)
pH, Arterial: 7.152 — CL (ref 7.350–7.450)
pO2, Arterial: 77.8 mmHg — ABNORMAL LOW (ref 83.0–108.0)

## 2019-03-12 LAB — URINE CULTURE: Culture: NO GROWTH

## 2019-03-12 LAB — CBC
HCT: 31.2 % — ABNORMAL LOW (ref 36.0–46.0)
Hemoglobin: 9.8 g/dL — ABNORMAL LOW (ref 12.0–15.0)
MCH: 35.3 pg — ABNORMAL HIGH (ref 26.0–34.0)
MCHC: 31.4 g/dL (ref 30.0–36.0)
MCV: 112.2 fL — ABNORMAL HIGH (ref 80.0–100.0)
Platelets: ADEQUATE 10*3/uL (ref 150–400)
RBC: 2.78 MIL/uL — ABNORMAL LOW (ref 3.87–5.11)
RDW: 15.8 % — ABNORMAL HIGH (ref 11.5–15.5)
WBC: 32.2 10*3/uL — ABNORMAL HIGH (ref 4.0–10.5)
nRBC: 0 % (ref 0.0–0.2)

## 2019-03-12 LAB — BASIC METABOLIC PANEL
Anion gap: 13 (ref 5–15)
BUN: 14 mg/dL (ref 6–20)
CO2: 17 mmol/L — ABNORMAL LOW (ref 22–32)
Calcium: 7.9 mg/dL — ABNORMAL LOW (ref 8.9–10.3)
Chloride: 107 mmol/L (ref 98–111)
Creatinine, Ser: 1.52 mg/dL — ABNORMAL HIGH (ref 0.44–1.00)
GFR calc Af Amer: 47 mL/min — ABNORMAL LOW (ref >60)
GFR calc non Af Amer: 40 mL/min — ABNORMAL LOW (ref >60)
Glucose, Bld: 85 mg/dL (ref 70–99)
Potassium: 4.8 mmol/L (ref 3.5–5.1)
Sodium: 137 mmol/L (ref 135–145)

## 2019-03-12 LAB — AMMONIA: Ammonia: 76 umol/L — ABNORMAL HIGH (ref 9–35)

## 2019-03-12 MED ORDER — GENERIC EXTERNAL MEDICATION
Status: DC
Start: ? — End: 2019-03-12

## 2019-03-13 ENCOUNTER — Other Ambulatory Visit (HOSPITAL_COMMUNITY): Payer: Medicare Other

## 2019-03-13 LAB — COMPREHENSIVE METABOLIC PANEL
ALT: 35 U/L (ref 0–44)
AST: 103 U/L — ABNORMAL HIGH (ref 15–41)
Albumin: 1 g/dL — ABNORMAL LOW (ref 3.5–5.0)
Alkaline Phosphatase: 357 U/L — ABNORMAL HIGH (ref 38–126)
Anion gap: 13 (ref 5–15)
BUN: 24 mg/dL — ABNORMAL HIGH (ref 6–20)
CO2: 14 mmol/L — ABNORMAL LOW (ref 22–32)
Calcium: 7.2 mg/dL — ABNORMAL LOW (ref 8.9–10.3)
Chloride: 110 mmol/L (ref 98–111)
Creatinine, Ser: 2.26 mg/dL — ABNORMAL HIGH (ref 0.44–1.00)
GFR calc Af Amer: 29 mL/min — ABNORMAL LOW (ref >60)
GFR calc non Af Amer: 25 mL/min — ABNORMAL LOW (ref >60)
Glucose, Bld: 63 mg/dL — ABNORMAL LOW (ref 70–99)
Potassium: 5.6 mmol/L — ABNORMAL HIGH (ref 3.5–5.1)
Sodium: 137 mmol/L (ref 135–145)
Total Bilirubin: 1.3 mg/dL — ABNORMAL HIGH (ref 0.3–1.2)
Total Protein: 4.6 g/dL — ABNORMAL LOW (ref 6.5–8.1)

## 2019-03-13 LAB — CBC
HCT: 32 % — ABNORMAL LOW (ref 36.0–46.0)
Hemoglobin: 10.1 g/dL — ABNORMAL LOW (ref 12.0–15.0)
MCH: 35.8 pg — ABNORMAL HIGH (ref 26.0–34.0)
MCHC: 31.6 g/dL (ref 30.0–36.0)
MCV: 113.5 fL — ABNORMAL HIGH (ref 80.0–100.0)
Platelets: 213 10*3/uL (ref 150–400)
RBC: 2.82 MIL/uL — ABNORMAL LOW (ref 3.87–5.11)
RDW: 16.2 % — ABNORMAL HIGH (ref 11.5–15.5)
WBC: 31.6 10*3/uL — ABNORMAL HIGH (ref 4.0–10.5)
nRBC: 0.1 % (ref 0.0–0.2)

## 2019-03-13 LAB — BLOOD GAS, ARTERIAL
Acid-base deficit: 10.8 mmol/L — ABNORMAL HIGH (ref 0.0–2.0)
Acid-base deficit: 9.5 mmol/L — ABNORMAL HIGH (ref 0.0–2.0)
Bicarbonate: 16.4 mmol/L — ABNORMAL LOW (ref 20.0–28.0)
Bicarbonate: 17.4 mmol/L — ABNORMAL LOW (ref 20.0–28.0)
Delivery systems: POSITIVE
Delivery systems: POSITIVE
Expiratory PAP: 6
Expiratory PAP: 8
FIO2: 50
FIO2: 70
Inspiratory PAP: 12
Inspiratory PAP: 20
O2 Saturation: 92 %
O2 Saturation: 97.1 %
Patient temperature: 95.7
Patient temperature: 97
pCO2 arterial: 45.5 mmHg (ref 32.0–48.0)
pCO2 arterial: 45.8 mmHg (ref 32.0–48.0)
pH, Arterial: 7.172 — CL (ref 7.350–7.450)
pH, Arterial: 7.197 — CL (ref 7.350–7.450)
pO2, Arterial: 68.4 mmHg — ABNORMAL LOW (ref 83.0–108.0)
pO2, Arterial: 107 mmHg (ref 83.0–108.0)

## 2019-03-13 MED ORDER — GENERIC EXTERNAL MEDICATION
Status: DC
Start: ? — End: 2019-03-13

## 2019-03-16 LAB — CULTURE, BLOOD (ROUTINE X 2)
Culture: NO GROWTH
Culture: NO GROWTH

## 2019-03-19 NOTE — Progress Notes (Signed)
  Echocardiogram 2D Echocardiogram has been performed.  Ariana Raymond 02/24/2019, 3:14 PM

## 2019-03-19 DEATH — deceased

## 2020-02-24 IMAGING — DX DG CHEST 1V PORT
1 series · 1 of 1 positions shown · non-contrast
Comparison: 02/27/2019 chest radiograph.

CLINICAL DATA: Hypoxia

EXAM:
PORTABLE CHEST 1 VIEW

[chest]
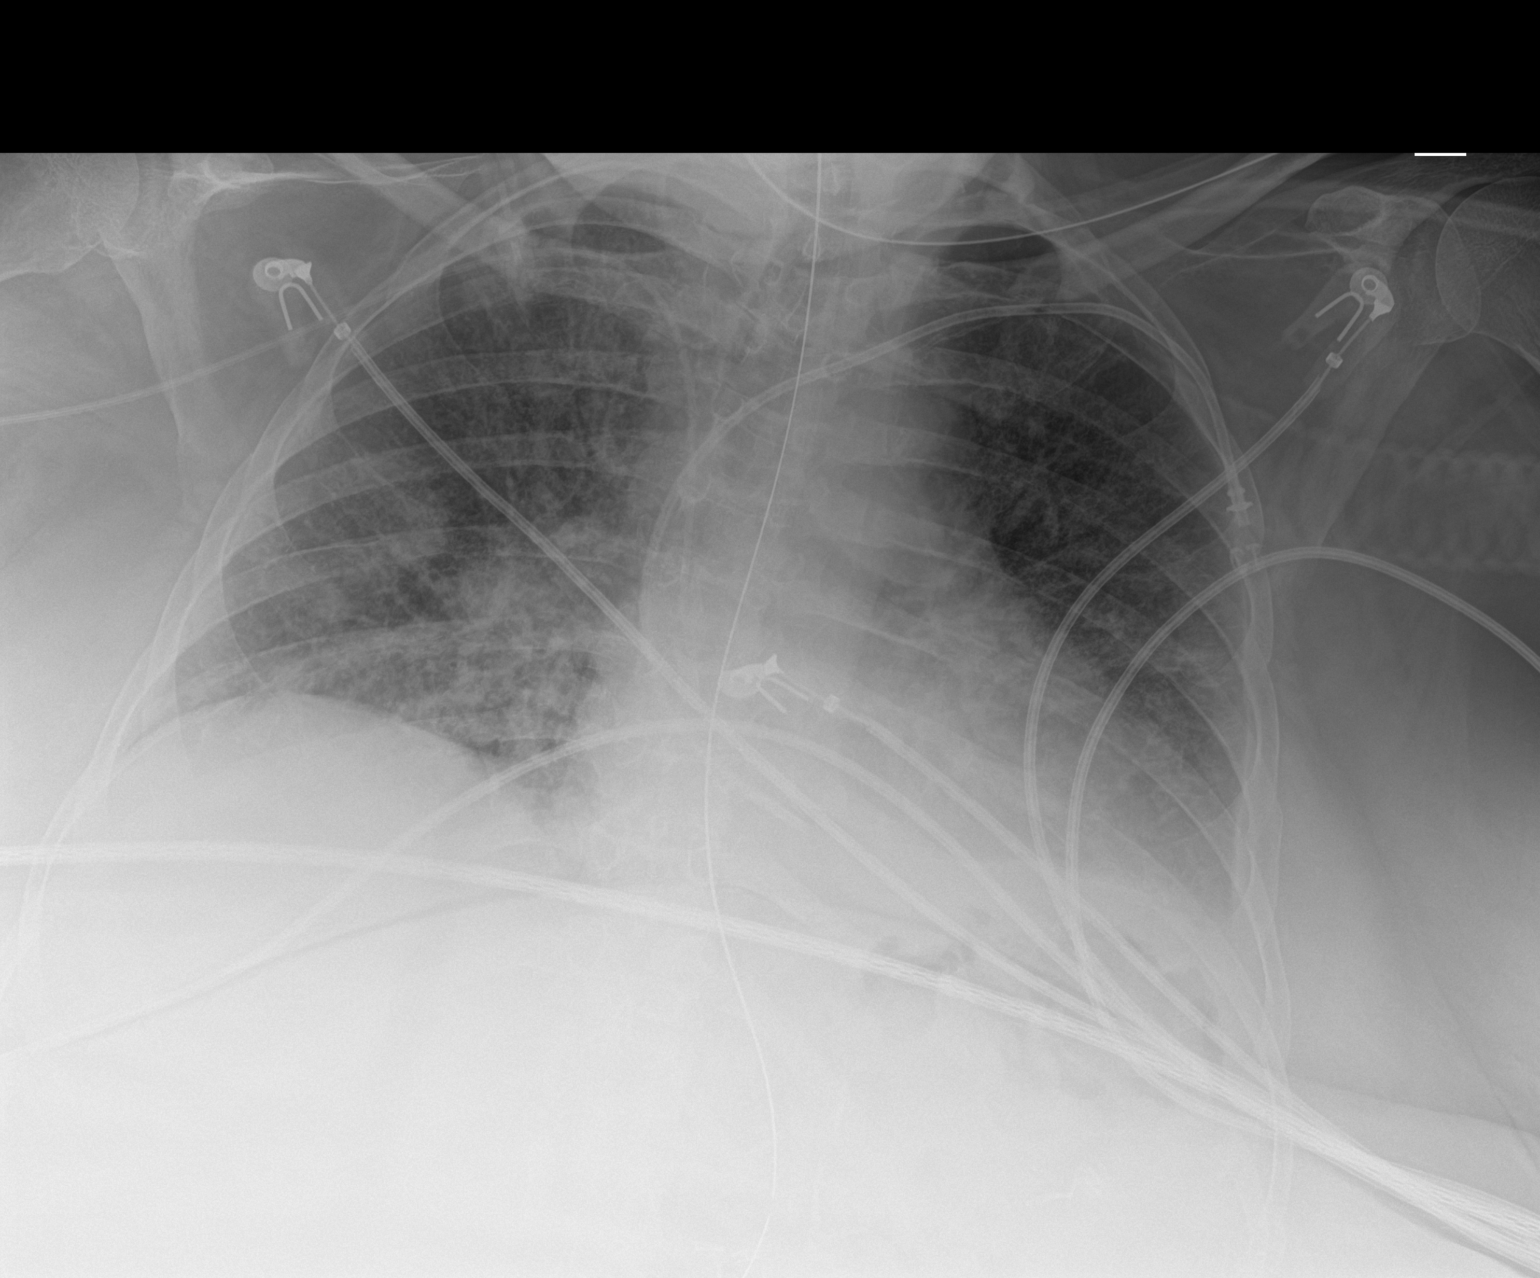

[1 of 1 positions shown; findings below may reference images not displayed]

FINDINGS: Right PICC terminates over the lower third of the SVC. Left
subclavian central venous catheter terminates over right atrium.
Enteric tube enters stomach with the tip not seen on this image.
Stable cardiomediastinal silhouette with top-normal heart size. No
pneumothorax. Trace left pleural effusion, probably stable. No right
pleural effusion. Increased hazy parahilar opacities throughout both
lungs.
IMPRESSION: 1. Support structures as detailed.  No pneumothorax.
2. Trace left pleural effusion, probably stable.
3. Increased hazy parahilar opacities throughout both lungs,
suggesting worsening pulmonary edema and/or atypical infection.

## 2020-02-25 IMAGING — DX DG CHEST 1V PORT
1 series · 1 of 1 positions shown · non-contrast
Comparison: 03/11/2019

CLINICAL DATA: Pleural effusions associated with pulmonary
infection.

EXAM:
PORTABLE CHEST 1 VIEW

[chest ap]
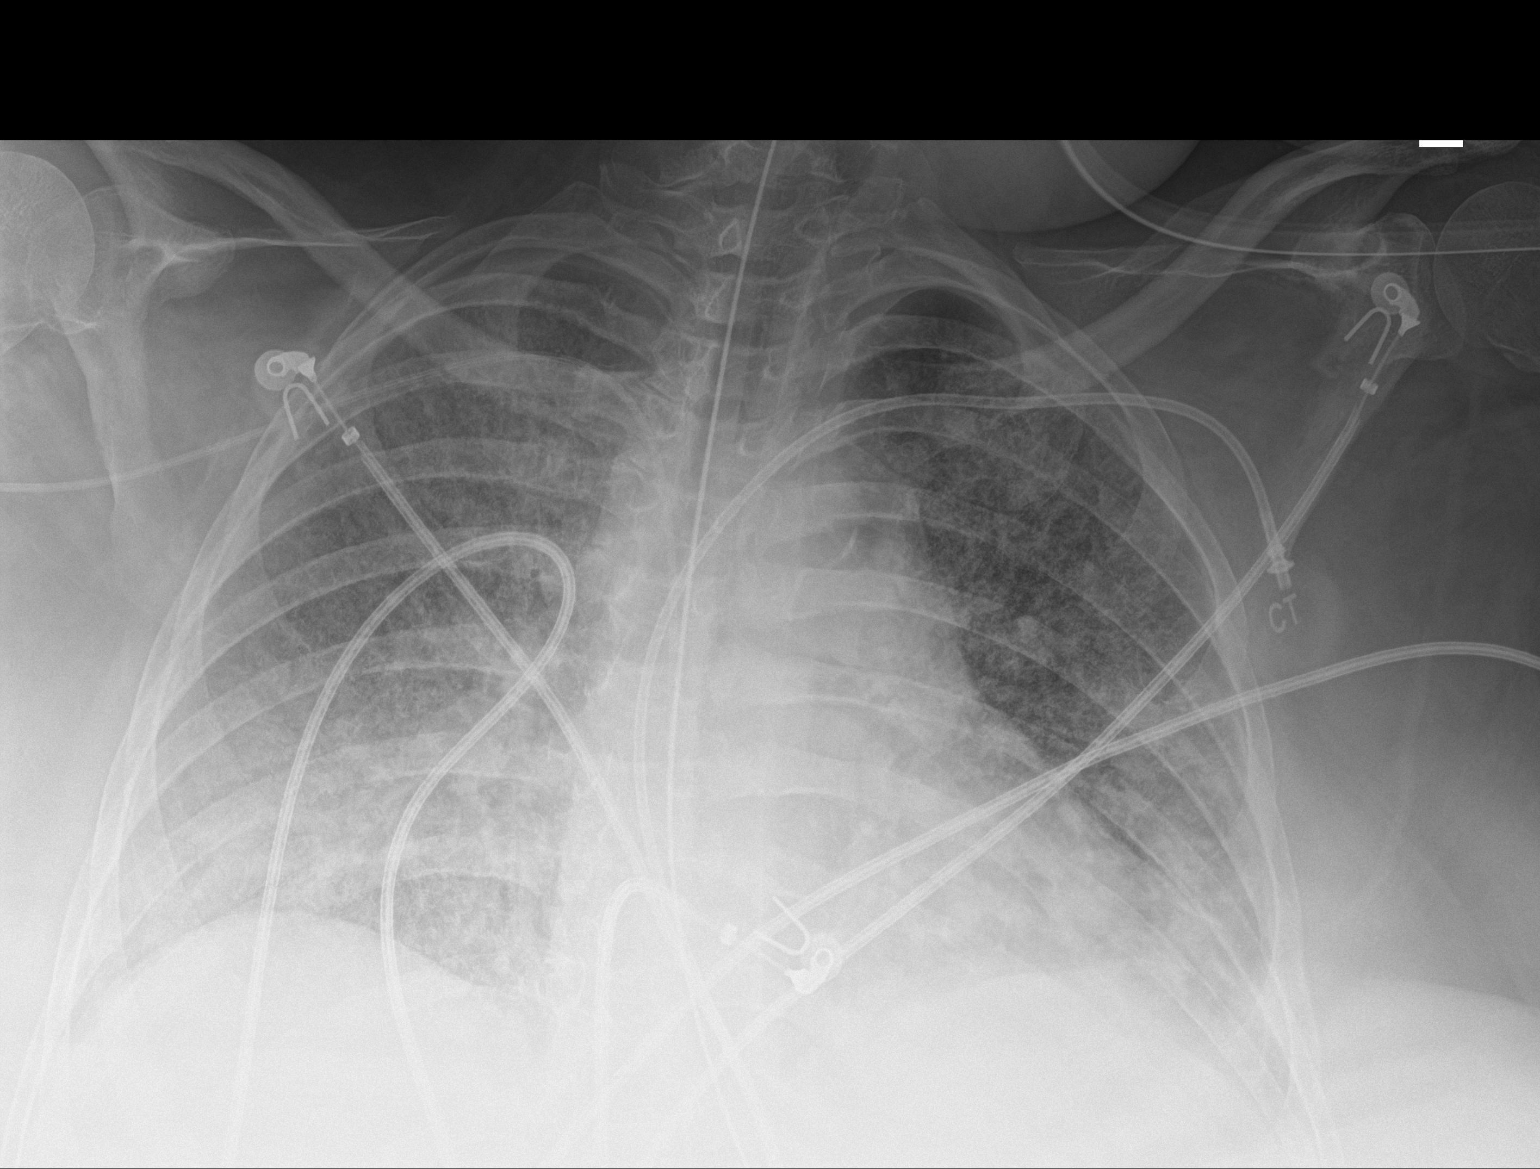

[1 of 1 positions shown; findings below may reference images not displayed]

FINDINGS: There is a left chest wall port a catheter with tip in the right
atrium. The enteric tube tip is below the level of the GE junction.
Right arm PICC line tip is at the lower SVC. Mild cardiac
enlargement. Bilateral interstitial and airspace densities are again
noted and have increased in the interval.
IMPRESSION: 1. Worsening aeration to the lungs.
2. Stable support apparatus.
